# Patient Record
Sex: Female | Born: 1955 | Race: White | Hispanic: No | Marital: Single | Smoking: Never smoker
Health system: Southern US, Community
[De-identification: ages and names within clinical notes are randomized; demographics above are authoritative.]

## PROBLEM LIST (undated history)

## (undated) DIAGNOSIS — R51 Headache: Secondary | ICD-10-CM

## (undated) DIAGNOSIS — E785 Hyperlipidemia, unspecified: Secondary | ICD-10-CM

## (undated) DIAGNOSIS — I10 Essential (primary) hypertension: Secondary | ICD-10-CM

## (undated) DIAGNOSIS — R519 Headache, unspecified: Secondary | ICD-10-CM

## (undated) HISTORY — DX: Hyperlipidemia, unspecified: E78.5

## (undated) HISTORY — DX: Headache, unspecified: R51.9

## (undated) HISTORY — DX: Essential (primary) hypertension: I10

## (undated) HISTORY — DX: Headache: R51

---

## 2000-04-11 ENCOUNTER — Encounter: Payer: Self-pay | Admitting: Orthopedic Surgery

## 2000-04-11 ENCOUNTER — Encounter: Admission: RE | Admit: 2000-04-11 | Discharge: 2000-04-11 | Payer: Self-pay | Admitting: Orthopedic Surgery

## 2000-04-18 ENCOUNTER — Ambulatory Visit (HOSPITAL_BASED_OUTPATIENT_CLINIC_OR_DEPARTMENT_OTHER): Admission: RE | Admit: 2000-04-18 | Discharge: 2000-04-18 | Payer: Self-pay | Admitting: Orthopedic Surgery

## 2000-12-16 ENCOUNTER — Ambulatory Visit (HOSPITAL_COMMUNITY): Admission: RE | Admit: 2000-12-16 | Discharge: 2000-12-16 | Payer: Self-pay | Admitting: Orthopedic Surgery

## 2001-08-20 ENCOUNTER — Other Ambulatory Visit: Admission: RE | Admit: 2001-08-20 | Discharge: 2001-08-20 | Payer: Self-pay | Admitting: Family Medicine

## 2002-06-17 HISTORY — PX: REPLACEMENT TOTAL KNEE: SUR1224

## 2002-10-22 ENCOUNTER — Ambulatory Visit (HOSPITAL_COMMUNITY): Admission: RE | Admit: 2002-10-22 | Discharge: 2002-10-22 | Payer: Self-pay | Admitting: Orthopedic Surgery

## 2004-06-04 ENCOUNTER — Inpatient Hospital Stay (HOSPITAL_COMMUNITY): Admission: RE | Admit: 2004-06-04 | Discharge: 2004-06-08 | Payer: Self-pay | Admitting: Orthopedic Surgery

## 2004-06-07 ENCOUNTER — Ambulatory Visit: Payer: Self-pay | Admitting: Physical Medicine & Rehabilitation

## 2005-07-25 ENCOUNTER — Ambulatory Visit: Payer: Self-pay | Admitting: Obstetrics & Gynecology

## 2006-02-24 IMAGING — CR DG CHEST 2V
3 series · 3 of 3 positions shown · non-contrast
Comparison: 06/05/04.

CLINICAL DATA: Shortness of breath.  Postop fever. 
 CHEST, TWO VIEWS, 06/06/04:

[view not recorded (1 of 3)]
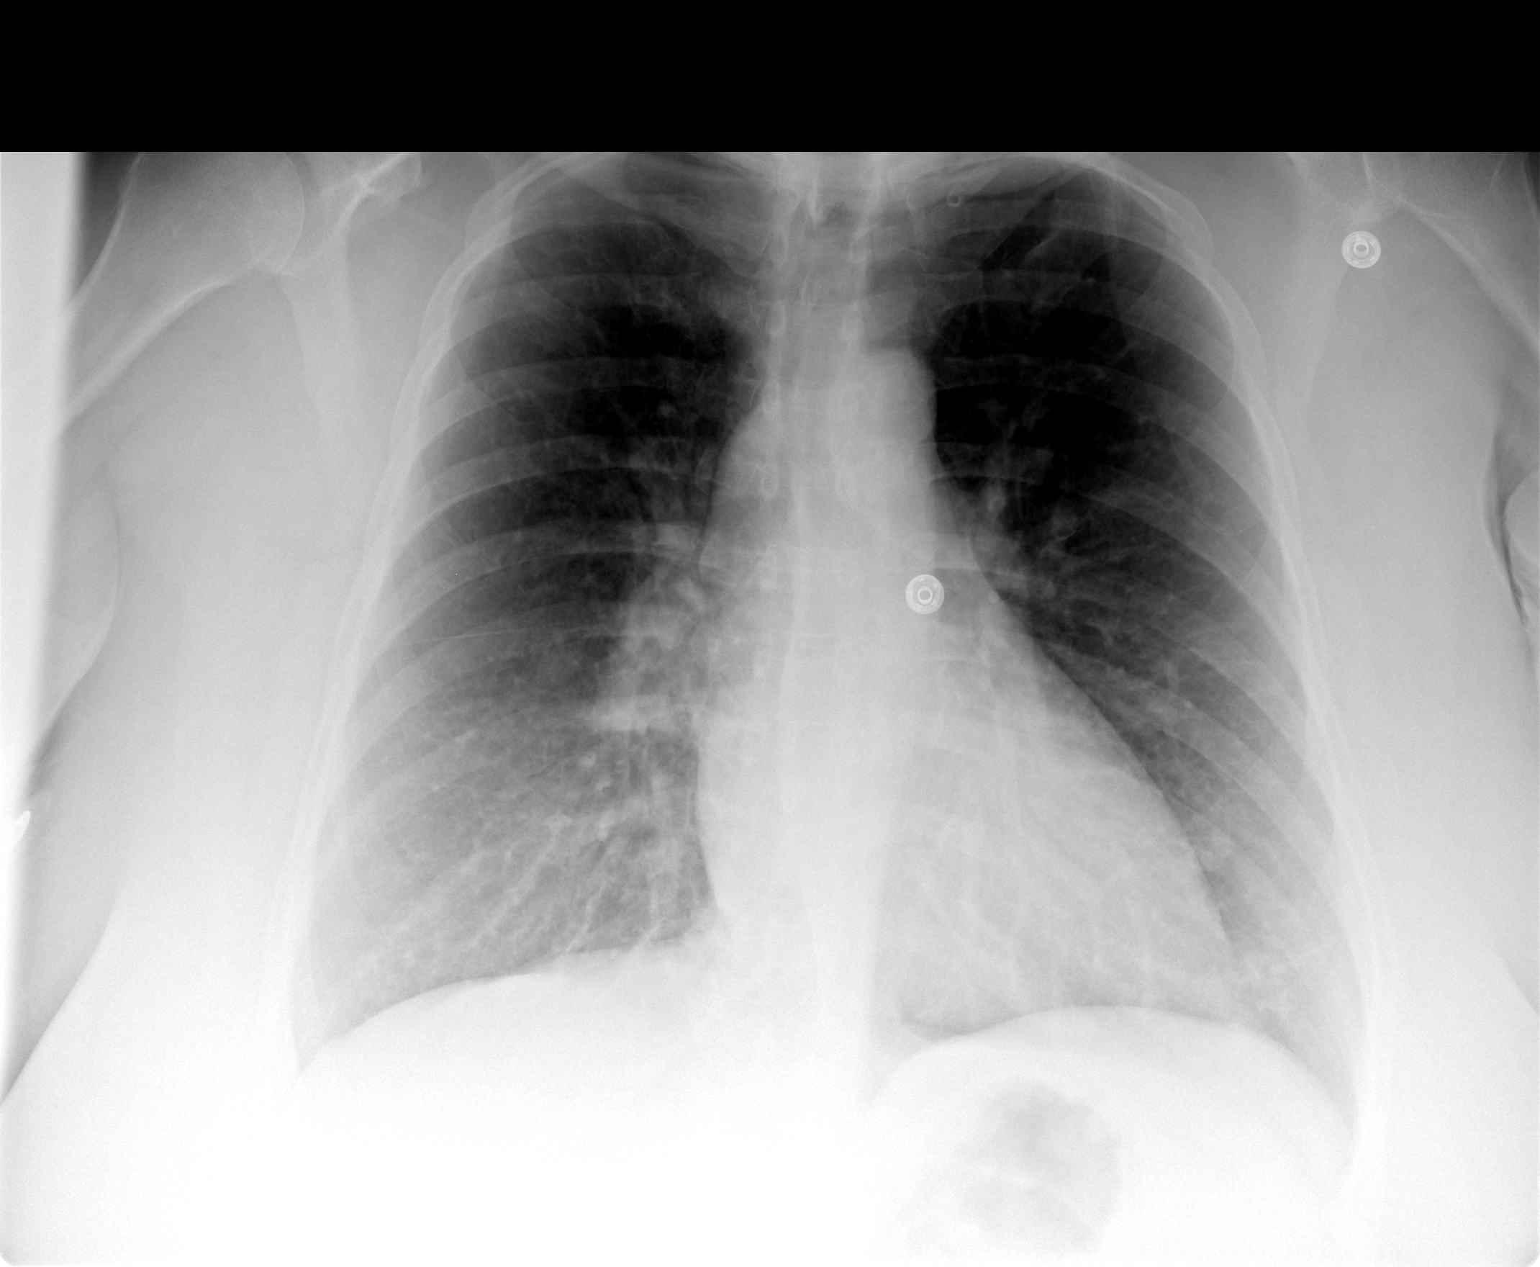

[view not recorded (2 of 3)]
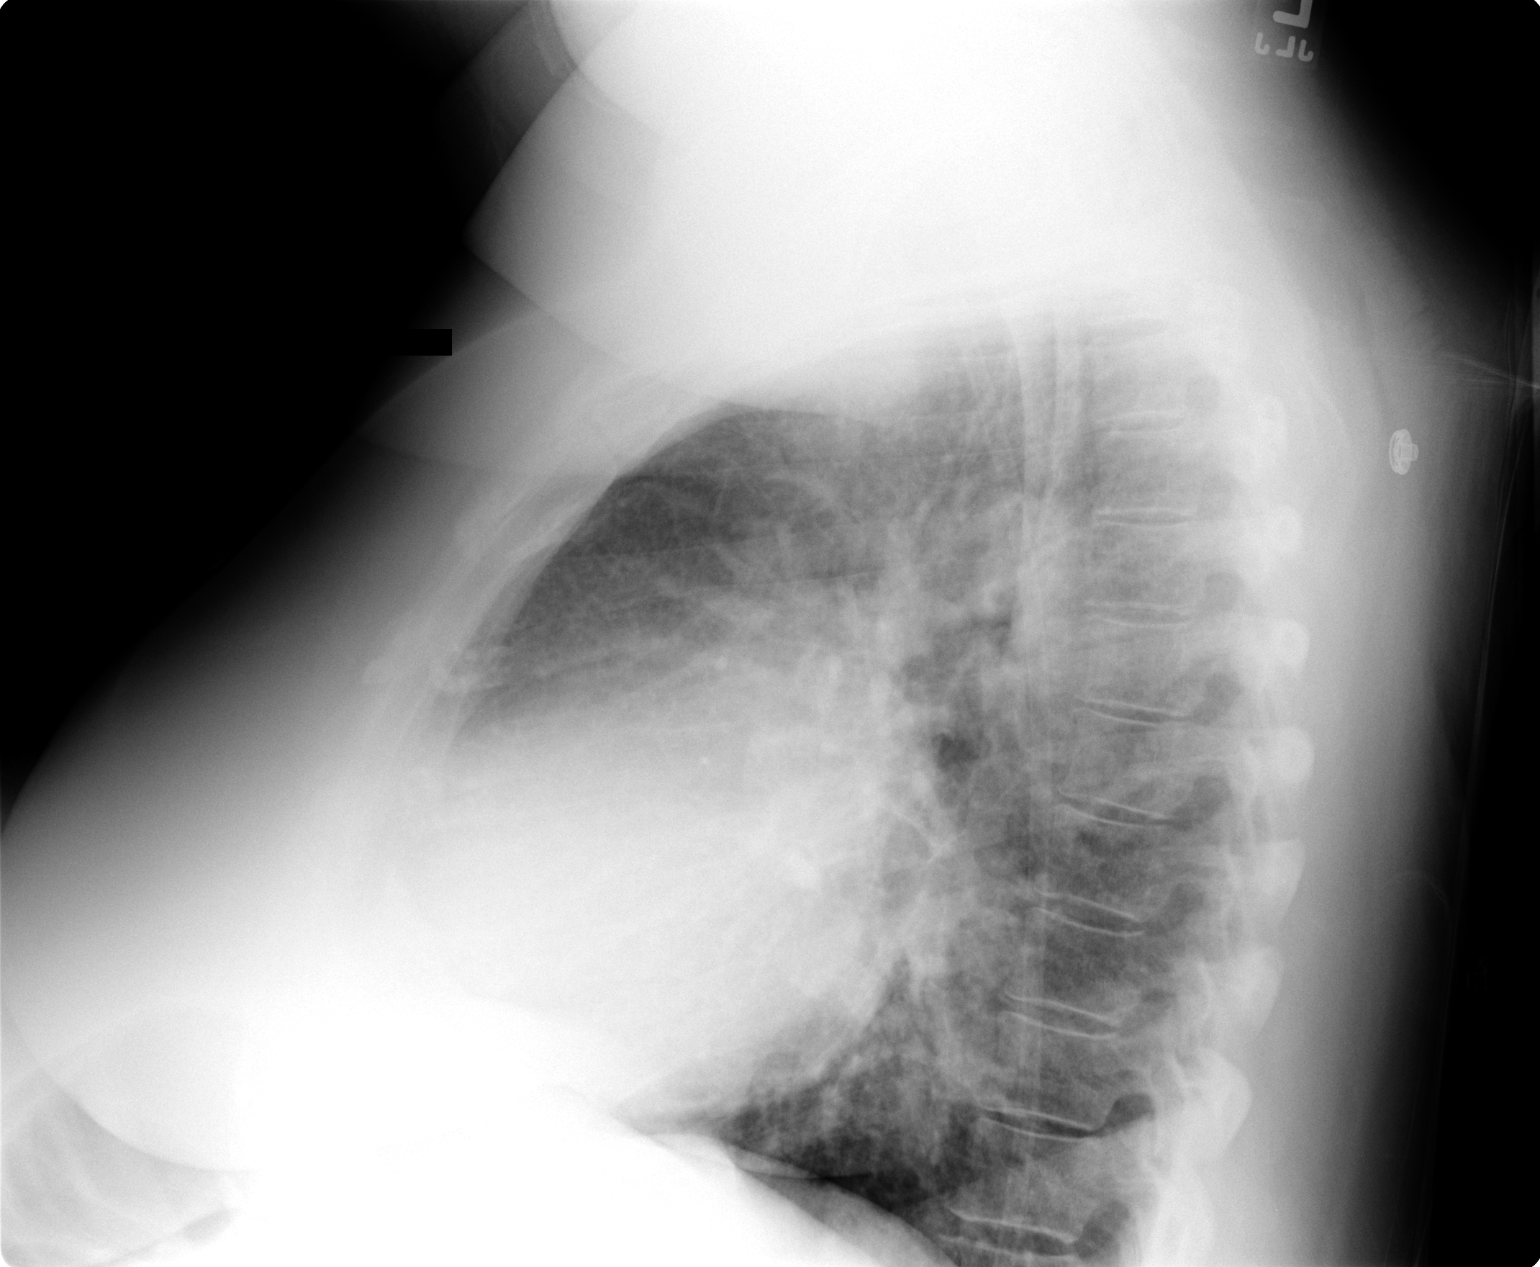

[view not recorded (3 of 3)]
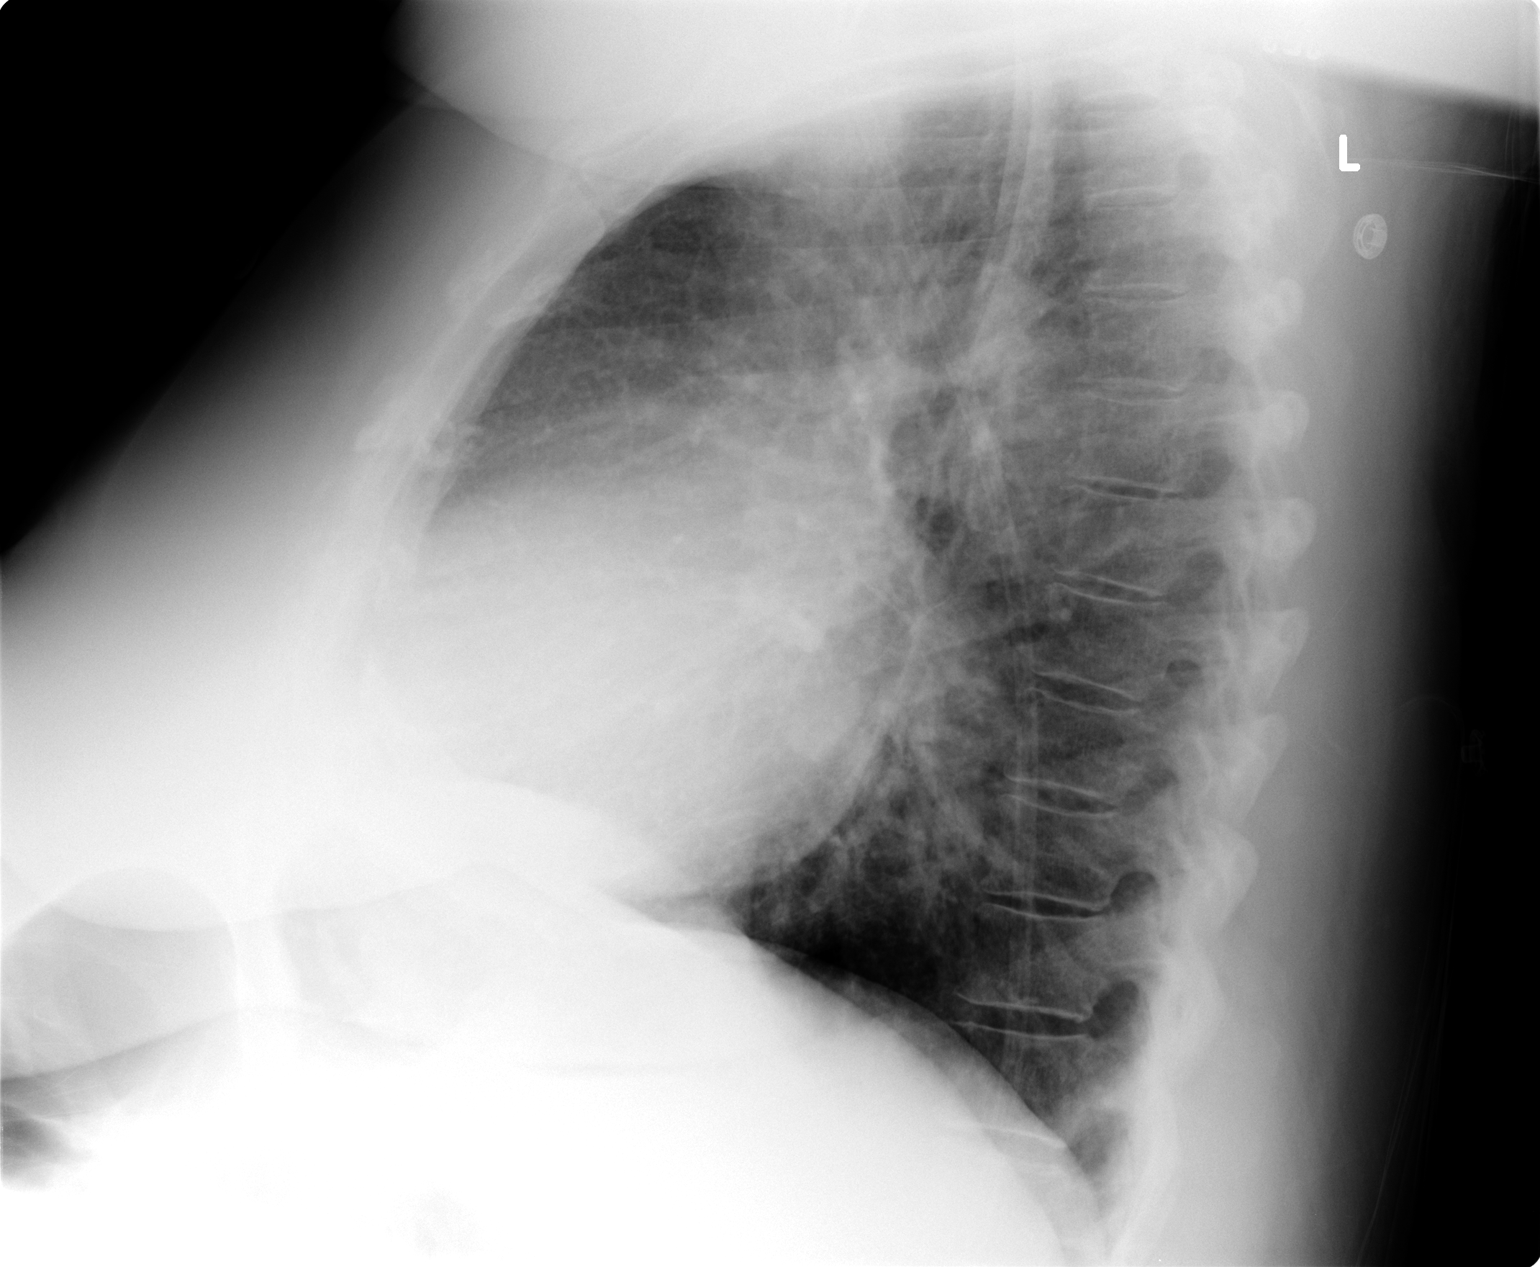

[3 of 3 positions shown; findings below may reference images not displayed]

There are no infiltrates.  There are mild bronchitic changes.  The heart and mediastinal structures are normal.
IMPRESSION: Mildly accentuated bronchial markings.  No acute infiltrate.

## 2016-04-11 ENCOUNTER — Telehealth: Payer: Self-pay | Admitting: Family Medicine

## 2016-04-11 NOTE — Telephone Encounter (Signed)
Pt states she has seen Nadine CountsBob in the past but not sure how long ago.   Pt does not have any appointments listed in the old system. Pt states she has a sore throat and right ear pain.   No Rx.  No insurance. Can pt reestablish?  CB#[phone]/MW

## 2016-04-12 ENCOUNTER — Ambulatory Visit (INDEPENDENT_AMBULATORY_CARE_PROVIDER_SITE_OTHER): Payer: Self-pay | Admitting: Family Medicine

## 2016-04-12 ENCOUNTER — Encounter: Payer: Self-pay | Admitting: Family Medicine

## 2016-04-12 VITALS — BP 130/68 | HR 72 | Temp 99.5°F | Resp 16 | Ht 63.5 in | Wt 279.8 lb

## 2016-04-12 DIAGNOSIS — E118 Type 2 diabetes mellitus with unspecified complications: Secondary | ICD-10-CM

## 2016-04-12 DIAGNOSIS — Z13220 Encounter for screening for lipoid disorders: Secondary | ICD-10-CM

## 2016-04-12 DIAGNOSIS — Z8669 Personal history of other diseases of the nervous system and sense organs: Secondary | ICD-10-CM

## 2016-04-12 DIAGNOSIS — E119 Type 2 diabetes mellitus without complications: Secondary | ICD-10-CM

## 2016-04-12 DIAGNOSIS — J029 Acute pharyngitis, unspecified: Secondary | ICD-10-CM

## 2016-04-12 MED ORDER — SUCRALFATE 1 GM/10ML PO SUSP
1.0000 g | Freq: Three times a day (TID) | ORAL | 0 refills | Status: DC
Start: 2016-04-12 — End: 2016-04-18

## 2016-04-12 MED ORDER — GLIPIZIDE 10 MG PO TABS
10.0000 mg | ORAL_TABLET | Freq: Two times a day (BID) | ORAL | 3 refills | Status: DC
Start: 2016-04-12 — End: 2017-02-17

## 2016-04-12 NOTE — Telephone Encounter (Signed)
Please review and advise. KW 

## 2016-04-12 NOTE — Telephone Encounter (Signed)
She will need a 30 minute appointment and will need to have explained payment policies. She and her partner Nedra HaiLee have expected low cost/no cost visits in the past.

## 2016-04-12 NOTE — Telephone Encounter (Signed)
I called pt back to advise.  Left message/MW

## 2016-04-12 NOTE — Progress Notes (Signed)
Subjective:     Patient ID: Jeanne Hinton, female   DOB: 13-Aug-1955, 60 y.o.   MRN: [id number]  HPI  Chief Complaint  Patient presents with  . Sore Throat    Patient comes in office today with concerns of difficulty swallowing for the past 5-7 days. Patient reports associated symptoms of cough and right ear pain radiating to jaw.  . Insect Bite    Patient comes in office today with complaints of multiple ant bites on both her feet, patient states this incident happened 1 week ago while in parking lot. Pain has receded.   States sore throat tends to improve towards the middle of the day then recurs. Minimal sinus congestion and cough. Reports occasional heartburn. States she has diabetes but not on medication and would like to start due to sugars frequently in the 200's.  Review of Systems  Neurological: Positive for headaches ( chronic headaches since childhood. Reports being seen by specialists and on a number of prophlactic and abortive medications with little improvement. Reports "bad headaches" once a week and wakes with headaches daily. Treating with large doses of Tylenol ).  States Fioricet worked the best for her in the past.     Objective:   Physical Exam  Constitutional: She appears well-developed and well-nourished. No distress.  Ears: T.M's intact without inflammation Throat: mild tonsillar enlargement with mild erythema but no exudate Neck: no cervical adenopathy Lungs: clear Skin: fading papules c/w insect bites on the dorsum of her bilateral feet.    Assessment:    1. Pharyngitis, unspecified etiology: sore throat with swallowing out of proportion and not c/w uri. - sucralfate (CARAFATE) 1 GM/10ML suspension; Take 10 mLs (1 g total) by mouth 4 (four) times daily -  with meals and at bedtime.  Dispense: 420 mL; Refill: 0  2. Type 2 diabetes mellitus without complication, without long-term current use of insulin (HCC) - Comprehensive metabolic panel - HgB A1c -  glipiZIDE (GLUCOTROL) 10 MG tablet; Take 1 tablet (10 mg total) by mouth 2 (two) times daily before a meal.  Dispense: 180 tablet; Refill: 3  3. Screening for cholesterol level - Lipid panel  4. History of migraine headaches    Plan:    Obtain and review old chart as has not been here in >1 year. Further f/u pending lab work. I suggested if we control her diabetes her headaches may improve.

## 2016-04-12 NOTE — Patient Instructions (Addendum)
We will call you with the lab results. I am going to obtain and review your old paper chart.

## 2016-04-12 NOTE — Telephone Encounter (Signed)
Please see below.

## 2016-04-15 ENCOUNTER — Telehealth: Payer: Self-pay | Admitting: Family Medicine

## 2016-04-15 NOTE — Telephone Encounter (Signed)
Rash that comes and goes that quickly is hives. May try Claritin (generic ok) for this. For your throat get Prevacid 15 mg over the counter and take two daily. May also take an antacid like TUMS or Rolaids. I will call you after I review your labs.

## 2016-04-15 NOTE — Telephone Encounter (Signed)
Patient states she was seen Friday and states that she can not swallow. Pain travels to patient from throat to ear. Patient states that pharmacy did not have medication and when it was available for pick up it was $200. Patient request that you call in a different Rx, patient states that she has also developed a rash that has  Come and gone first appearing on her left leg than on arms and yesterday she states it was on arms, back and legs. Patient describes rash as itchy red and raised. Please advise. KW

## 2016-04-15 NOTE — Telephone Encounter (Signed)
Patient was advised as directed below.  Thanks,  -Joseline 

## 2016-04-15 NOTE — Telephone Encounter (Signed)
Pt states she seen Jeanne Hinton on Friday and is not any better.   Pt is having trouble swolling and having right ear pain.  Pt is also having a itchy rash all over but it only last about an hour then it goes away.  This has happened 3 times over the weekend.  Pt states the pharmacy did not have the medication in stock until today and it is $200.00.  Pt is asking if she can get a Rx that will be less expensive.  [facility].  CB#[phone]/MW

## 2016-04-17 ENCOUNTER — Telehealth: Payer: Self-pay | Admitting: Family Medicine

## 2016-04-17 NOTE — Telephone Encounter (Signed)
Pt called states the Rx for sucralfate (CARAFATE) 1 GM/10ML suspension is going to be $200.00 so she is now requesting this resent to [facility] [address] [place]  in a pill form.  CB#[phone]/MW

## 2016-04-17 NOTE — Telephone Encounter (Signed)
Please see previous telephone encounter from 04/15/16. I spoke with patient on the phone and she states that she has not had prescription for Sucralfate that was changed to liquid form because cost of medicine is $192.00. Patient states that she would like for you to change prescription back to pill form, in previous message patient states that pill form was $200.00 patient now is stating that pharmacy she found it would only cost her $31.00. Patient was informed by pharmacist that this medication was not in dissolvable form she states when [facility] went to [facility] that a pharmacist there demonstrated in front of her by placing medication in water and watching it dissolve. Patient reports difficulty swallowing and states that she would dissolve Rx in water. Patient was seen in our office on 10/27 and has not started sucralfate since being prescribed, she reports that she has been taking Mylanta BID. Please advise on what you would like to do next. KW

## 2016-04-18 ENCOUNTER — Other Ambulatory Visit: Payer: Self-pay | Admitting: Family Medicine

## 2016-04-18 DIAGNOSIS — J029 Acute pharyngitis, unspecified: Secondary | ICD-10-CM

## 2016-04-18 MED ORDER — SUCRALFATE 1 G PO TABS: 1.0000 g | ORAL_TABLET | Freq: Three times a day (TID) | ORAL | 1 refills | Status: AC

## 2016-04-18 NOTE — Telephone Encounter (Signed)
Pt advised. Maguire Sime Drozdowski, CMA  

## 2016-04-18 NOTE — Telephone Encounter (Signed)
Medication sent in. 

## 2016-04-20 LAB — COMPREHENSIVE METABOLIC PANEL
ALT: 23 IU/L (ref 0–32)
AST: 22 IU/L (ref 0–40)
Albumin/Globulin Ratio: 1.4 (ref 1.2–2.2)
Albumin: 4.2 g/dL (ref 3.6–4.8)
Alkaline Phosphatase: 56 IU/L (ref 39–117)
BUN/Creatinine Ratio: 10 — ABNORMAL LOW (ref 12–28)
BUN: 7 mg/dL — ABNORMAL LOW (ref 8–27)
Bilirubin Total: 0.4 mg/dL (ref 0.0–1.2)
CO2: 22 mmol/L (ref 18–29)
Calcium: 9.2 mg/dL (ref 8.7–10.3)
Chloride: 97 mmol/L (ref 96–106)
Creatinine, Ser: 0.67 mg/dL (ref 0.57–1.00)
GFR calc Af Amer: 110 mL/min/{1.73_m2} (ref >59)
GFR calc non Af Amer: 96 mL/min/{1.73_m2} (ref >59)
Globulin, Total: 3 g/dL (ref 1.5–4.5)
Glucose: 264 mg/dL — ABNORMAL HIGH (ref 65–99)
Potassium: 4.3 mmol/L (ref 3.5–5.2)
Sodium: 137 mmol/L (ref 134–144)
Total Protein: 7.2 g/dL (ref 6.0–8.5)

## 2016-04-20 LAB — HEMOGLOBIN A1C
Est. average glucose Bld gHb Est-mCnc: 189 mg/dL
Hgb A1c MFr Bld: 8.2 % — ABNORMAL HIGH (ref 4.8–5.6)

## 2016-04-20 LAB — LIPID PANEL
CHOLESTEROL TOTAL: 220 mg/dL — AB (ref 100–199)
Chol/HDL Ratio: 6.7 ratio units — ABNORMAL HIGH (ref 0.0–4.4)
HDL: 33 mg/dL — AB (ref >39)
LDL Calculated: 147 mg/dL — ABNORMAL HIGH (ref 0–99)
Triglycerides: 201 mg/dL — ABNORMAL HIGH (ref 0–149)
VLDL CHOLESTEROL CAL: 40 mg/dL (ref 5–40)

## 2016-04-22 ENCOUNTER — Other Ambulatory Visit: Payer: Self-pay | Admitting: Family Medicine

## 2016-04-22 ENCOUNTER — Telehealth: Payer: Self-pay

## 2016-04-22 DIAGNOSIS — E782 Mixed hyperlipidemia: Secondary | ICD-10-CM

## 2016-04-22 MED ORDER — ATORVASTATIN CALCIUM 80 MG PO TABS
80.0000 mg | ORAL_TABLET | Freq: Every day | ORAL | 0 refills | Status: DC
Start: 2016-04-22 — End: 2016-05-03

## 2016-04-22 NOTE — Telephone Encounter (Signed)
Pt returned your called regarding lab results. CB# is [phone]. Is requesting call back today. Allene DillonEmily Drozdowski, CMA

## 2016-04-22 NOTE — Telephone Encounter (Signed)
done

## 2016-05-03 ENCOUNTER — Telehealth: Payer: Self-pay | Admitting: Family Medicine

## 2016-05-03 ENCOUNTER — Other Ambulatory Visit: Payer: Self-pay | Admitting: Family Medicine

## 2016-05-03 DIAGNOSIS — E782 Mixed hyperlipidemia: Secondary | ICD-10-CM

## 2016-05-03 MED ORDER — ATORVASTATIN CALCIUM 80 MG PO TABS: 80.0000 mg | ORAL_TABLET | Freq: Every day | ORAL | 3 refills | Status: DC

## 2016-05-03 NOTE — Telephone Encounter (Signed)
Pt contacted office for refill request on the following medications:  90 day supply.   [facility] [address] [place].  CB#[phone]/MW  atorvastatin (LIPITOR) 80 MG tablet

## 2016-05-03 NOTE — Telephone Encounter (Signed)
For review. KW 

## 2016-05-03 NOTE — Telephone Encounter (Signed)
Refill completed.

## 2017-02-17 ENCOUNTER — Other Ambulatory Visit: Payer: Self-pay | Admitting: Family Medicine

## 2017-02-17 DIAGNOSIS — E119 Type 2 diabetes mellitus without complications: Secondary | ICD-10-CM

## 2017-05-15 ENCOUNTER — Other Ambulatory Visit: Payer: Self-pay | Admitting: Family Medicine

## 2017-05-15 DIAGNOSIS — E782 Mixed hyperlipidemia: Secondary | ICD-10-CM

## 2018-03-27 ENCOUNTER — Other Ambulatory Visit: Payer: Self-pay | Admitting: Family Medicine

## 2018-03-27 DIAGNOSIS — E119 Type 2 diabetes mellitus without complications: Secondary | ICD-10-CM
